# Patient Record
Sex: Male | Born: 1970 | Race: White | Hispanic: No | Marital: Married | Smoking: Current every day smoker
Health system: Southern US, Community
[De-identification: ages and names within clinical notes are randomized; demographics above are authoritative.]

## PROBLEM LIST (undated history)

## (undated) DIAGNOSIS — Z6838 Body mass index (BMI) 38.0-38.9, adult: Secondary | ICD-10-CM

## (undated) DIAGNOSIS — R55 Syncope and collapse: Secondary | ICD-10-CM

## (undated) DIAGNOSIS — I10 Essential (primary) hypertension: Secondary | ICD-10-CM

## (undated) HISTORY — DX: Body mass index (BMI) 38.0-38.9, adult: Z68.38

## (undated) HISTORY — DX: Syncope and collapse: R55

## (undated) HISTORY — PX: BACK SURGERY: SHX140

---

## 2001-06-21 ENCOUNTER — Emergency Department (HOSPITAL_COMMUNITY): Admission: EM | Admit: 2001-06-21 | Discharge: 2001-06-21 | Payer: Self-pay | Admitting: *Deleted

## 2001-06-21 ENCOUNTER — Encounter: Payer: Self-pay | Admitting: *Deleted

## 2002-05-31 ENCOUNTER — Emergency Department (HOSPITAL_COMMUNITY): Admission: EM | Admit: 2002-05-31 | Discharge: 2002-05-31 | Payer: Self-pay | Admitting: Emergency Medicine

## 2002-05-31 ENCOUNTER — Encounter: Payer: Self-pay | Admitting: Emergency Medicine

## 2003-04-26 ENCOUNTER — Emergency Department (HOSPITAL_COMMUNITY): Admission: EM | Admit: 2003-04-26 | Discharge: 2003-04-26 | Payer: Self-pay | Admitting: *Deleted

## 2003-04-26 ENCOUNTER — Encounter: Payer: Self-pay | Admitting: *Deleted

## 2004-12-17 ENCOUNTER — Emergency Department (HOSPITAL_COMMUNITY): Admission: EM | Admit: 2004-12-17 | Discharge: 2004-12-17 | Payer: Self-pay | Admitting: Emergency Medicine

## 2005-07-24 ENCOUNTER — Emergency Department (HOSPITAL_COMMUNITY): Admission: EM | Admit: 2005-07-24 | Discharge: 2005-07-24 | Payer: Self-pay | Admitting: Emergency Medicine

## 2008-08-12 ENCOUNTER — Emergency Department (HOSPITAL_COMMUNITY): Admission: EM | Admit: 2008-08-12 | Discharge: 2008-08-12 | Payer: Self-pay | Admitting: Emergency Medicine

## 2016-02-10 ENCOUNTER — Emergency Department (HOSPITAL_BASED_OUTPATIENT_CLINIC_OR_DEPARTMENT_OTHER)
Admission: EM | Admit: 2016-02-10 | Discharge: 2016-02-10 | Disposition: A | Discharge status: Home / Self Care | Payer: Worker's Compensation | Attending: Emergency Medicine | Admitting: Emergency Medicine

## 2016-02-10 ENCOUNTER — Encounter (HOSPITAL_BASED_OUTPATIENT_CLINIC_OR_DEPARTMENT_OTHER): Payer: Self-pay | Admitting: *Deleted

## 2016-02-10 ENCOUNTER — Emergency Department (HOSPITAL_BASED_OUTPATIENT_CLINIC_OR_DEPARTMENT_OTHER): Payer: Worker's Compensation

## 2016-02-10 DIAGNOSIS — F17210 Nicotine dependence, cigarettes, uncomplicated: Secondary | ICD-10-CM | POA: Insufficient documentation

## 2016-02-10 DIAGNOSIS — Y9389 Activity, other specified: Secondary | ICD-10-CM | POA: Diagnosis not present

## 2016-02-10 DIAGNOSIS — Y9289 Other specified places as the place of occurrence of the external cause: Secondary | ICD-10-CM | POA: Insufficient documentation

## 2016-02-10 DIAGNOSIS — W010XXA Fall on same level from slipping, tripping and stumbling without subsequent striking against object, initial encounter: Secondary | ICD-10-CM | POA: Insufficient documentation

## 2016-02-10 DIAGNOSIS — T148 Other injury of unspecified body region: Secondary | ICD-10-CM | POA: Diagnosis not present

## 2016-02-10 DIAGNOSIS — F0390 Unspecified dementia without behavioral disturbance: Secondary | ICD-10-CM | POA: Diagnosis not present

## 2016-02-10 DIAGNOSIS — I10 Essential (primary) hypertension: Secondary | ICD-10-CM | POA: Insufficient documentation

## 2016-02-10 DIAGNOSIS — Y990 Civilian activity done for income or pay: Secondary | ICD-10-CM | POA: Insufficient documentation

## 2016-02-10 DIAGNOSIS — S80812A Abrasion, left lower leg, initial encounter: Secondary | ICD-10-CM | POA: Diagnosis not present

## 2016-02-10 DIAGNOSIS — T148XXA Other injury of unspecified body region, initial encounter: Secondary | ICD-10-CM

## 2016-02-10 DIAGNOSIS — S8992XA Unspecified injury of left lower leg, initial encounter: Secondary | ICD-10-CM | POA: Diagnosis present

## 2016-02-10 HISTORY — DX: Essential (primary) hypertension: I10

## 2016-02-10 NOTE — ED Notes (Signed)
Pt reports that a concrete slab fell on his left ankle at work.  Reports inability to bear weight since that time. No obvious deformity noted.

## 2016-02-10 NOTE — ED Provider Notes (Signed)
CSN: [id number]     Arrival date & time 02/10/16  1539 History   First MD Initiated Contact with Patient 02/10/16 1553     Chief Complaint  Patient presents with  . Leg Injury    HPI   [age] show male presents today with left leg pain. Patient said he was at work sitting on a concrete bench when he stood up he seemed to dementia and fell landing on the left distal medial aspect of his lower leg and ankle. He reports after the incident he was unable to be awake due to significant pain. Patient denies any prior history of thrombus with the leg, reports chronic back pain for which he takes 1600 mg of ibuprofen daily. Patient reports that he was able to get into the ED with assistance from his boss. Patient denies any loss of distal sensation and strength or motor function.    Past Medical History  Diagnosis Date  . Hypertension    Past Surgical History  Procedure Laterality Date  . Back surgery     History reviewed. No pertinent family history. Social History  Substance Use Topics  . Smoking status: Current Every Day Smoker -- 0.50 packs/day    Types: Cigarettes  . Smokeless tobacco: None  . Alcohol Use: Yes    Review of Systems  All other systems reviewed and are negative.     Allergies  Bee venom  Home Medications   Prior to Admission medications   Medication Sig Start Date End Date Taking? Authorizing Provider  UNKNOWN TO PATIENT    Yes Historical Provider, MD   BP 154/101 mmHg  Pulse 102  Temp(Src) 98.3 F (36.8 C) (Oral)  Resp 18  Ht 5\' 9"  (1.753 m)  Wt 113.399 kg  BMI 36.90 kg/m2  SpO2 98%   Physical Exam  Constitutional: He is oriented to person, place, and time. He appears well-developed and well-nourished.  HENT:  Head: Normocephalic and atraumatic.  Eyes: Conjunctivae are normal. Pupils are equal, round, and reactive to light. Right eye exhibits no discharge. Left eye exhibits no discharge. No scleral icterus.  Neck: Normal range of motion. No JVD  present. No tracheal deviation present.  Pulmonary/Chest: Effort normal. No stridor.  Musculoskeletal:  Very superficial abrasion to the medial aspect of the left lower extremity. Patient has very minor tenderness to palpation of the superior aspect of the medial malleolus and distal shin. Patient is exquisitely tender to even very minor touch. Absent weightbearing were successful, patient appeared to be in discomfort with weightbearing, but had no pain when I forcibly pushed on his heel with bulbous foot. No pain to palpation proximal fibula, no pain to palpation of the proximal foot, sensation intact, Refill less than 3 seconds, pedal pulse 2 plus; no signs of compartment syndrome  Neurological: He is alert and oriented to person, place, and time. Coordination normal.  Psychiatric: He has a normal mood and affect. His behavior is normal. Judgment and thought content normal.  Nursing note and vitals reviewed.   ED Course  Procedures (including critical care time) Labs Review Labs Reviewed - No data to display  Imaging Review Dg Tibia/fibula Left  02/10/2016  CLINICAL DATA:  Pt states a concrete slab fell onto his left lower leg striking medially and around lower calf area, redness and bruising to distal tib/fib both lateral malleolus and medial malleolus EXAM: LEFT TIBIA AND FIBULA - 2 VIEW COMPARISON:  None. FINDINGS: No fracture. Knee and ankle joints are normally aligned. No  bone lesion. There is soft tissue edema along the lower aspect of the leg, most evident medially. No soft tissue air or radiopaque foreign body. IMPRESSION: No fracture or dislocation.  No radiopaque foreign body. Electronically Signed   By: Amie Portland M.D.   On: 02/10/2016 16:40   Dg Ankle Complete Left  02/10/2016  CLINICAL DATA:  Concrete slab fell on left lower leg, medial lower calf pain redness and bruising, medial malleolus pain EXAM: LEFT ANKLE COMPLETE - 3+ VIEW COMPARISON:  None. FINDINGS: Three views of the  left ankle submitted. No acute fracture or subluxation. There is mild spurring of distal tibia and fibula. Ankle mortise is preserved. Plantar and posterior spurring of calcaneus. There is a tiny well corticated bony fragment adjacent to tip of distal fibula most likely from prior injury. IMPRESSION: No acute fracture or subluxation. Mild spurring of distal tibia and fibula. Ankle mortise is preserved. Plantar and posterior spurring of calcaneus. Electronically Signed   By: Natasha Mead M.D.   On: 02/10/2016 16:40   I have personally reviewed and evaluated these images and lab results as part of my medical decision-making.   EKG Interpretation None      MDM   Final diagnoses:  Contusion    Labs:  Imaging:  Consults:  Therapeutics:  Discharge Meds:   Assessment/Plan: 89 show male presents today with left leg pain. Patient has very mild superficial abrasion with no obvious deformity or significant signs of trauma. Patient wincing in pain even prior to me touching his skin, has no pain when I push on the bottom of his foot, but expresses pain with Weightbearing. His X-Ray Showed No Significant Findings of Acute Trauma, He Has No Signs of Compartment Syndrome or Any Signs That Would Indicate Occult Fracture. Patient will be given instructions to continue using ibuprofen, ice, crutches as needed, follow up with his primary care for reevaluation and further management. Patient given strict cautions, verbalize understanding and agreement to the basement and no further questions or concerns at time discharge.         Eyvonne Mechanic, PA-C 02/10/16 1647  Loren Racer, MD 02/10/16 [phone]

## 2016-02-10 NOTE — ED Notes (Signed)
Demonstrated crutch instructions well. Declined offer for tylenol/motrin. Declined wheelchair.

## 2017-01-07 ENCOUNTER — Telehealth: Payer: Self-pay | Admitting: *Deleted

## 2017-01-07 NOTE — Telephone Encounter (Signed)
NOTES SENT TO SCHEDULING.  °

## 2017-01-11 ENCOUNTER — Ambulatory Visit: Payer: Self-pay | Admitting: Cardiology

## 2017-01-16 DIAGNOSIS — R059 Cough, unspecified: Secondary | ICD-10-CM | POA: Insufficient documentation

## 2017-01-16 DIAGNOSIS — J029 Acute pharyngitis, unspecified: Secondary | ICD-10-CM | POA: Insufficient documentation

## 2017-01-16 DIAGNOSIS — R05 Cough: Secondary | ICD-10-CM | POA: Insufficient documentation

## 2017-01-16 DIAGNOSIS — M791 Myalgia, unspecified site: Secondary | ICD-10-CM | POA: Insufficient documentation

## 2017-01-16 DIAGNOSIS — R55 Syncope and collapse: Secondary | ICD-10-CM | POA: Insufficient documentation

## 2017-01-16 DIAGNOSIS — R0981 Nasal congestion: Secondary | ICD-10-CM | POA: Insufficient documentation

## 2017-01-16 DIAGNOSIS — H538 Other visual disturbances: Secondary | ICD-10-CM | POA: Insufficient documentation

## 2017-01-16 DIAGNOSIS — R42 Dizziness and giddiness: Secondary | ICD-10-CM | POA: Insufficient documentation

## 2017-01-17 ENCOUNTER — Encounter (INDEPENDENT_AMBULATORY_CARE_PROVIDER_SITE_OTHER): Payer: Self-pay

## 2017-01-17 ENCOUNTER — Ambulatory Visit (INDEPENDENT_AMBULATORY_CARE_PROVIDER_SITE_OTHER): Payer: BLUE CROSS/BLUE SHIELD | Admitting: Internal Medicine

## 2017-01-17 VITALS — BP 132/86 | HR 76 | Ht 68.0 in | Wt 248.0 lb

## 2017-01-17 DIAGNOSIS — R42 Dizziness and giddiness: Secondary | ICD-10-CM | POA: Diagnosis not present

## 2017-01-17 DIAGNOSIS — R55 Syncope and collapse: Secondary | ICD-10-CM | POA: Diagnosis not present

## 2017-01-17 NOTE — Patient Instructions (Signed)
Medication Instructions:    Your physician recommends that you continue on your current medications as directed. Please refer to the Current Medication list given to you today.  --- If you need a refill on your cardiac medications before your next appointment, please call your pharmacy. ---  Labwork:  None ordered  Testing/Procedures: Your physician has requested that you have an echocardiogram. Echocardiography is a painless test that uses sound waves to create images of your heart. It provides your doctor with information about the size and shape of your heart and how well your heart's chambers and valves are working. This procedure takes approximately one hour. There are no restrictions for this procedure.   Your physician has recommended that you have a tilt table test with possible loop recorder insertion. This test is sometimes used to help determine the cause of fainting spells. You lie on a table that moves from a lying down to an upright position. The change in position can bring on loss of consciousness. The doctor monitors your symptoms, heart rate, EKG, and blood pressure throughout the test. The doctor also may give you a medicine and then monitor your response to the medicine. This is done in the hospital and usually takes half of a day to complete the procedure.   Herbert Seta, RN will call you to arrange this procedure.    Follow-Up:  To be determined once procedure is scheduled.  Thank you for choosing [facility]!!     Any Other Special Instructions Will Be Listed Below (If Applicable).   Tilt Table Test A tilt table test is a test to find the cause of unexplained problems, such as:  Fainting.  Dizziness.  Falls.  A drop in blood pressure when standing up (orthostatic hypotension). For this test, you will be safely secured to a table that moves you from a lying position to an upright position. Your heart rhythm and blood pressure will be monitored during the  test. While having this test, it is normal to:  Feel lightheaded or dizzy.  Faint.  Feel nauseous or vomit.  Have blurry vision.  Feel cold or clammy. Tell a health care provider about:  All medicines you are taking, including vitamins, herbs, eye drops, creams, and over-the-counter medicines.  Any surgeries you have had.  Any medical conditions you have.  Whether you are pregnant or may be pregnant. What are the risks? There are no risks or complications associated with this test. What happens before the procedure?  Follow instructions from your health care provider about eating or drinking restrictions.  Ask your health care provider about changing or stopping your regular medicines. This is especially important if you are taking diabetes medicines or blood thinners.  Let your primary health care provider know that you are having this test.  Plan to have someone take you home after the test. What happens during the procedure?  You will be asked to lie down on a table. The table will have a footboard and safety straps to keep you in place.  An IV tube will be inserted into a vein in your hand or arm.  Your blood pressure, heart rate, breathing rate, and blood oxygen level will be monitored throughout the test.  While you are lying down:  A stethoscope will be used to listen to arteries in your neck (carotid arteries) to check for unusual sounds (bruits).  Your carotid arteries will be pressed on one at a time, for several seconds each.  You will be  placed in an upright position. You will need to tell your health care provider right away if you feel dizzy or like you are going to faint.  If you feel dizzy, your blood pressure drops, or your heart rate drops, you will be placed in a flat or head-down position. If your heart rate or blood pressure does not return to normal after that, you may be given medicine to help with symptoms of low blood pressure or a slow heart  rate. The medicine may be given under your tongue or through the IV tube.  If you do not have symptoms when placed in an upright position, medicine may be given to make you feel dizzy. The medicine may be given under your tongue or through the IV tube. This test may vary among health care providers and hospitals. What happens after the procedure?  Your health care provider will tell you when may go home.  It is your responsibility to get your test results. Ask your health care provider or the department performing the test when your results will be ready. This information is not intended to replace advice given to you by your health care provider. Make sure you discuss any questions you have with your health care provider. Document Released: 10/17/2004 Document Revised: 07/08/2016 Document Reviewed: 07/30/2015 Elsevier Interactive Patient Education  2017 [employer]

## 2017-01-17 NOTE — Progress Notes (Signed)
ELECTROPHYSIOLOGY CONSULT NOTE  Patient ID: Roberto Turner, MRN: [id number], DOB/AGE: 1971/07/06 46 y.o. Admit date: (Not on file) Date of Consult: 01/17/2017  Primary Physician: Selinda Flavin, MD Primary Cardiologist: NEW Consulting Physician NEW  Chief Complaint: Roberto Turner   HPI Roberto Turner is a 46 y.o. male  Referred for evaluation for recurrent syncope.  He has had multiple syncopal episodes over his lifetime although the first was incurred in the context of severe pain i.e. back pain and or nephrolithiasis. More recent episodes occurred in some poorly defined timeframe in the last couple of months and a temporal relationship to an antecedent viral illness that neither he nor his wife can clarify.  The first episode occurred when he got up out of the chair walk across the room got dizzy and lost consciousness was out for about 1-2 minutes. He was able to awaken and stand without residual symptoms. His wife does not describe skin color. His other episode is hard to understand. Fall asleep on the side of the bed. His son pulled his arm and he fell sideways off the bed and awakened on the floor he was able to stand without residua.  He denies orthostatic symptoms. He denies heat intolerance or shower intolerance.  He has hypertension and has been on losartan for some time. He has had no palpitations. He has no chest discomfort. He denies exercise intolerance.\        Past Medical History:  Diagnosis Date  . Body mass index (bmi) 38.0-38.9, adult   . Hypertension   . Syncope and collapse       Surgical History:  Past Surgical History:  Procedure Laterality Date  . BACK SURGERY       Home Meds: Prior to Admission medications   Medication Sig Start Date End Date Taking? Authorizing Provider  ibuprofen (ADVIL,MOTRIN) 800 MG tablet Take 1 tablet by mouth daily. 12/13/16  Yes Historical Provider, MD  losartan (COZAAR) 100 MG tablet Take 100 mg by mouth daily.   Yes Historical  Provider, MD    Allergies:  Allergies  Allergen Reactions  . Bee Venom Anaphylaxis    Social History   Social History  . Marital status: Married    Spouse name: N/A  . Number of children: N/A  . Years of education: N/A   Occupational History  . Not on file.   Social History Main Topics  . Smoking status: Current Every Day Smoker    Packs/day: 0.50    Types: Cigarettes  . Smokeless tobacco: Never Used  . Alcohol use Yes  . Drug use: No  . Sexual activity: Not on file   Other Topics Concern  . Not on file   Social History Narrative  . No narrative on file     No family history on file.   ROS:  Please see the history of present illness.     All other systems reviewed and negative.    Physical Exam: Blood pressure 132/86, pulse 76, height 5\' 8"  (1.727 m), weight 248 lb (112.5 kg), SpO2 97 %. General: Well developed, well nourished male in no acute distress. Head: Normocephalic, atraumatic, sclera non-icteric, no xanthomas, nares are without discharge. EENT: normal  Lymph Nodes:  none Neck: Negative for carotid bruits. JVD not elevated. Back:without scoliosis kyphosis Lungs: Clear bilaterally to auscultation without wheezes, rales, or rhonchi. Breathing is unlabored. Heart: RRR with S1 S2. No   murmur . No rubs, or gallops appreciated. Abdomen: Soft, non-tender,  non-distended with normoactive bowel sounds. No hepatomegaly. No rebound/guarding. No obvious abdominal masses. Msk:  Strength and tone appear normal for age. Extremities: No clubbing or cyanosis. No  edema.  Distal pedal pulses are 2+ and equal bilaterally. Skin: Warm and Dry Neuro: Alert and oriented X 3. CN III-XII intact Grossly normal sensory and motor function . Psych:  Responds to questions appropriately with a normal affect.      Labs: Cardiac Enzymes No results for input(s): CKTOTAL, CKMB, TROPONINI in the last 72 hours. CBC No results found for: WBC, HGB, HCT, MCV, PLT PROTIME: No results  for input(s): LABPROT, INR in the last 72 hours. Chemistry No results for input(s): NA, K, CL, CO2, BUN, CREATININE, CALCIUM, PROT, BILITOT, ALKPHOS, ALT, AST, GLUCOSE in the last 168 hours.  Invalid input(s): LABALBU Lipids No results found for: CHOL, HDL, LDLCALC, TRIG BNP No results found for: PROBNP Thyroid Function Tests: No results for input(s): TSH, T4TOTAL, T3FREE, THYROIDAB in the last 72 hours.  Invalid input(s): FREET3 Miscellaneous No results found for: DDIMER  Radiology/Studies:  No results found.  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 There are couple of things that would suggest that this is noncardiac, and these would include his prior history of syncope is orthostatic intolerance as manifested today. To exclude cardiac syncope however, it is important to look at left ventricular function. Cardiac syncope is associated with a much higher risk of sudden death. His QRS is not normal. It could be wide for LVH; however, [place] QRS widening is associated with a higher risk of an arrhythmic cause of syncope and loop recording may be of value  His symptoms however are still more likely vasovagal motor; given that and his atypical symptoms I think tilt table testing affords us our best chance at a right diagnosis without invasive testing.  If he were tilt negative I would recommend a loop recorder insertion given  recurrent events and the atypical history  Part of the challenge here is that even if this is reflex syncope I don't see the trigger and his prodrome isn't long enough to protect himself.  He is advised not to drive for 6 months   He also needs sleep study and therapy for his sleep  apnea          Sherryl MangesSteven Savvy Peeters

## 2017-01-21 NOTE — Addendum Note (Signed)
Addended by: Dareen PianoKENAN, Deania Siguenza C on: 01/21/2017 02:46 PM   Modules accepted: Orders

## 2017-01-23 ENCOUNTER — Telehealth: Payer: Self-pay | Admitting: Internal Medicine

## 2017-01-23 NOTE — Telephone Encounter (Signed)
I left a message for the patient to call to try to arrange his tilt table test with possible loop recorder.

## 2017-01-25 NOTE — Telephone Encounter (Signed)
Late entry- call received back from the patient on 01/24/17. He states that his understanding was to get the echo results first then decide if the EP study and LINQ were needed. I advised I will follow up with Dr. Graciela HusbandsKlein about this. He is agreeable.

## 2017-02-01 ENCOUNTER — Ambulatory Visit (HOSPITAL_COMMUNITY)
Admission: RE | Admit: 2017-02-01 | Discharge: 2017-02-01 | Disposition: A | Discharge status: Home / Self Care | Payer: BLUE CROSS/BLUE SHIELD | Source: Ambulatory Visit | Attending: Internal Medicine | Admitting: Internal Medicine

## 2017-02-01 DIAGNOSIS — R55 Syncope and collapse: Secondary | ICD-10-CM | POA: Diagnosis not present

## 2017-02-01 NOTE — Progress Notes (Signed)
  Echocardiogram 2D Echocardiogram has been performed.  Delcie RochENNINGTON, Lorenz Donley 02/01/2017, 10:28 AM

## 2017-02-04 ENCOUNTER — Telehealth: Payer: Self-pay | Admitting: Internal Medicine

## 2017-02-04 NOTE — Telephone Encounter (Signed)
Follow Up:    Pt wants to know if his Echo test results are ready from 02-01-17 please?

## 2017-02-04 NOTE — Telephone Encounter (Signed)
Follow up ° ° ° °Pt is calling about his echo results.  °

## 2017-02-04 NOTE — Telephone Encounter (Signed)
I called and spoke with the patient. I advised him Dr. Graciela HusbandsKlein is out of the office this week and will not sign off on his report until the beginning of next week. Preliminary echo results given.  I advised I will call back next week with Dr. Odessa FlemingKlein's recommendations. He voices understanding.

## 2017-02-18 ENCOUNTER — Telehealth: Payer: Self-pay | Admitting: Cardiology

## 2017-02-18 DIAGNOSIS — R931 Abnormal findings on diagnostic imaging of heart and coronary circulation: Secondary | ICD-10-CM

## 2017-02-18 NOTE — Telephone Encounter (Signed)
Follow Up:    Pt retuning Heather's call from Friday,concerning his Echo results.

## 2017-02-18 NOTE — Telephone Encounter (Signed)
Notes recorded by Duke Salvia, MD on 02/11/2017 at 11:29 AM EDT Please Inform Patient Echo showed ab norma heart muscle function with ef 45% We dont suspect CAD but prior to tilt would recomment MRI if can tolerate otherwise myoview Thanks steve  02/18/17--I spoke with pt's wife, Roberto Turner.--Pt at work. I discussed Dr Odessa Fleming comments and recommendations with her.   Roberto Turner is going to discuss with pt and ask him to give [place] a call back.

## 2017-02-21 NOTE — Telephone Encounter (Signed)
Follow Up    Per pt returning phone call from nurse. Was not sure what it was pertaining to. Requesting call back from nurse

## 2017-02-21 NOTE — Telephone Encounter (Signed)
The patient is aware of his results. He is agreeable with Dr. Odessa Fleming recommendations for a cardiac MRI as he has not problem going through an MRI scanner.  I advised I will order this and Dirk Dress will be in touch to schedule.

## 2017-02-22 ENCOUNTER — Telehealth: Payer: Self-pay | Admitting: Internal Medicine

## 2017-02-22 NOTE — Telephone Encounter (Signed)
Called the patient and left a voicemail to call me with a preferred day and time for his cardiac MRI.

## 2017-02-25 ENCOUNTER — Telehealth: Payer: Self-pay | Admitting: Internal Medicine

## 2017-02-25 NOTE — Telephone Encounter (Signed)
Called patient and left a voicemail for him to call me back regarding scheduling his cardiac MRI.

## 2017-02-27 ENCOUNTER — Encounter: Payer: Self-pay | Admitting: Internal Medicine

## 2017-02-27 ENCOUNTER — Telehealth: Payer: Self-pay | Admitting: Internal Medicine

## 2017-02-27 NOTE — Telephone Encounter (Signed)
Called patient and left voicemail regarding cardiac MRI scheduled for 03-06-17 at 3 p.m. At [facility].  Letter mailed today.

## 2017-03-01 ENCOUNTER — Telehealth: Payer: Self-pay | Admitting: Internal Medicine

## 2017-03-01 NOTE — Telephone Encounter (Signed)
New message    Pt states that he wants to go ahead with the procedure you two had talked about and wants to let you know.

## 2017-03-05 ENCOUNTER — Telehealth: Payer: Self-pay | Admitting: Internal Medicine

## 2017-03-05 NOTE — Telephone Encounter (Signed)
Called the patient and left a message regarding his appointment tomorrow for his MRI.

## 2017-03-06 ENCOUNTER — Ambulatory Visit (HOSPITAL_COMMUNITY)
Admission: RE | Admit: 2017-03-06 | Discharge: 2017-03-06 | Disposition: A | Discharge status: Home / Self Care | Payer: BLUE CROSS/BLUE SHIELD | Source: Ambulatory Visit | Attending: Internal Medicine | Admitting: Internal Medicine

## 2017-03-06 ENCOUNTER — Ambulatory Visit (HOSPITAL_COMMUNITY): Payer: BLUE CROSS/BLUE SHIELD

## 2017-03-06 DIAGNOSIS — R931 Abnormal findings on diagnostic imaging of heart and coronary circulation: Secondary | ICD-10-CM

## 2017-03-06 DIAGNOSIS — R55 Syncope and collapse: Secondary | ICD-10-CM | POA: Diagnosis not present

## 2017-03-06 LAB — CREATININE, SERUM
Creatinine, Ser: 1.08 mg/dL (ref 0.61–1.24)
GFR calc Af Amer: >60 mL/min (ref >60)
GFR calc non Af Amer: >60 mL/min (ref >60)

## 2017-03-06 MED ORDER — GADOBENATE DIMEGLUMINE 529 MG/ML IV SOLN
40.0000 mL | Freq: Once | INTRAVENOUS | Status: AC | PRN
  Administered 2017-03-06: 40 mL via INTRAVENOUS

## 2017-03-12 ENCOUNTER — Telehealth: Payer: Self-pay | Admitting: Internal Medicine

## 2017-03-12 NOTE — Telephone Encounter (Signed)
New message    Pt is calling about the results of his blood work and his procedure he had done. He said the results were on mychart, but he did not understand.

## 2017-03-12 NOTE — Telephone Encounter (Signed)
Will review next steps with Dr. Graciela Husbands based on MRI report prior to calling the patient.

## 2017-03-19 NOTE — Telephone Encounter (Signed)
Notes recorded by Jefferey Pica, RN on 03/12/2017 at 7:02 PM EDT Reviewed with Dr. Graciela Husbands- since the Cardiac MRI is normal, next step would be to proceed with a tilt table test and it they is negative implant a LINQ. The patient is aware of his results and Dr. Odessa Fleming recommendations. He is agreeable- per Dr. Graciela Husbands, he will speak with the EP lab staff to see if we can work this in prior to 04/17/17.  The patient is aware I will call him back next week at the latest.   Per Dr. Graciela Husbands- no way to work in the Tilt/ with possible LINQ prior to 04/17/17 per his discussion with the EP lab.  I have left a message for the patient to call to schedule for 04/17/17.

## 2017-03-20 ENCOUNTER — Encounter: Payer: Self-pay | Admitting: *Deleted

## 2017-03-20 NOTE — Telephone Encounter (Signed)
I spoke with the patient. He is aware that it will be 04/17/17 before we can do his Tilt Tablet with +/- LINQ implant. He is agreeable. This is scheduled for 2pm- he is aware to arrive at 12pm Instructions reviewed with the patient and paper copy mailed to him. He verbalizes understanding.

## 2017-04-03 ENCOUNTER — Telehealth: Payer: Self-pay

## 2017-04-03 NOTE — Telephone Encounter (Signed)
Patient called back and spoke with Dennis BastKelly Lanier, RN. He is aware of procedure time change and NPO after midnight.

## 2017-04-03 NOTE — Telephone Encounter (Addendum)
Left message on patients home voicemail to call back to go over change in procedure time for Tilt table test with or without Linq implant. scheduled for 04/17/17. Patient will need to arrive at hospital at 11 am rather then 12 pm. Patient will also need to be instructed that he cannot eat or drink anything after midnight. Instructed patient to call the office back to discuss changes.

## 2017-04-17 ENCOUNTER — Ambulatory Visit (HOSPITAL_COMMUNITY)
Admission: RE | Admit: 2017-04-17 | Discharge: 2017-04-17 | Disposition: A | Discharge status: Home / Self Care | Payer: BLUE CROSS/BLUE SHIELD | Source: Ambulatory Visit | Attending: Internal Medicine | Admitting: Internal Medicine

## 2017-04-17 ENCOUNTER — Encounter (HOSPITAL_COMMUNITY)
Admission: RE | Disposition: A | Discharge status: Home / Self Care | Payer: Self-pay | Source: Ambulatory Visit | Attending: Internal Medicine

## 2017-04-17 DIAGNOSIS — F17210 Nicotine dependence, cigarettes, uncomplicated: Secondary | ICD-10-CM | POA: Diagnosis not present

## 2017-04-17 DIAGNOSIS — Z6837 Body mass index (BMI) 37.0-37.9, adult: Secondary | ICD-10-CM | POA: Diagnosis not present

## 2017-04-17 DIAGNOSIS — R55 Syncope and collapse: Secondary | ICD-10-CM | POA: Diagnosis not present

## 2017-04-17 DIAGNOSIS — Z79899 Other long term (current) drug therapy: Secondary | ICD-10-CM | POA: Insufficient documentation

## 2017-04-17 DIAGNOSIS — E669 Obesity, unspecified: Secondary | ICD-10-CM | POA: Insufficient documentation

## 2017-04-17 DIAGNOSIS — I10 Essential (primary) hypertension: Secondary | ICD-10-CM | POA: Diagnosis not present

## 2017-04-17 HISTORY — PX: TILT TABLE STUDY: EP1226

## 2017-04-17 SURGERY — TILT TABLE STUDY

## 2017-04-17 MED ORDER — DEXTROSE 5 % IV SOLN
INTRAVENOUS | Status: DC
  Administered 2017-04-17: 12:00:00 via INTRAVENOUS

## 2017-04-17 MED ORDER — NITROGLYCERIN 0.4 MG SL SUBL
SUBLINGUAL_TABLET | SUBLINGUAL | Status: DC | PRN
  Administered 2017-04-17: .4 mg via SUBLINGUAL

## 2017-04-17 MED ORDER — SODIUM CHLORIDE 0.9 % IV SOLN
INTRAVENOUS | Status: DC
Start: 2017-04-17 — End: 2017-04-17
  Administered 2017-04-17: 13:00:00 via INTRAVENOUS

## 2017-04-17 MED ORDER — NITROGLYCERIN 0.4 MG SL SUBL
SUBLINGUAL_TABLET | SUBLINGUAL | Status: AC
  Filled 2017-04-17: qty 1

## 2017-04-17 SURGICAL SUPPLY — 1 items: PAD DEFIB LIFELINK (PAD) ×1 IMPLANT

## 2017-04-17 NOTE — H&P (Signed)
      Patient Care Team: Selinda FlavinHoward, Kevin, MD as PCP - General (Family Medicine)   HPI  Roberto Turner is a 46 y.o. male Admitted for tilt table testing and possible LINQ insertion  He has recurrent syncope with features suggestive and not of neurally mediated mechanisms. He has modest prolongation of his intrinsic QRS--120 msec concerning for bradyarrhythmia based on ISSUE 3   No intercurrent events  Records and Results Reviewed   Past Medical History:  Diagnosis Date  . Body mass index (bmi) 38.0-38.9, adult   . Hypertension   . Syncope and collapse     Past Surgical History:  Procedure Laterality Date  . BACK SURGERY      Current Facility-Administered Medications  Medication Dose Route Frequency Provider Last Rate Last Dose  . 0.9 %  sodium chloride infusion   Intravenous Continuous Duke SalviaKlein, Steven C, MD 200 mL/hr at 04/17/17 1305      Allergies  Allergen Reactions  . Bee Venom Anaphylaxis      Social History  Substance Use Topics  . Smoking status: Current Every Day Smoker    Packs/day: 0.50    Types: Cigarettes  . Smokeless tobacco: Never Used  . Alcohol use Yes     No family history on file.   No current facility-administered medications on file prior to encounter.    Current Outpatient Prescriptions on File Prior to Encounter  Medication Sig Dispense Refill  . ibuprofen (ADVIL,MOTRIN) 800 MG tablet Take 800 mg by mouth daily as needed for mild pain.   3  . losartan (COZAAR) 100 MG tablet Take 100 mg by mouth daily.        Review of Systems negative except from HPI and PMH  Physical Exam BP (!) 137/106 (BP Location: Left Arm)   Pulse 64   Temp 98.3 F (36.8 C) (Oral)   Resp 18   Ht 5\' 9"  (1.753 m)   Wt 250 lb (113.4 kg)   BMI 36.92 kg/m  Well developed and nourished in no acute distress HENT normal Neck supple with JVP-flat Carotids brisk and full without bruits Clear Regular rate and rhythm, no murmurs or gallops Abd-soft with active BS  without hepatomegaly No Clubbing cyanosis edema Skin-warm and dry A & Oriented  Grossly normal sensory and motor function     Assessment and  Plan  Syncope    hypertension-treated    QRS widening   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

## 2017-04-18 ENCOUNTER — Encounter (HOSPITAL_COMMUNITY): Payer: Self-pay | Admitting: Internal Medicine

## 2018-05-07 IMAGING — MR MR CARD MORPHOLOGY WO/W CM
10 of 11 series · 39 of 40 positions shown · IV contrast (multihance)
Comparison: none

CLINICAL DATA: Syncope, decreased EF on echo.

EXAM:
CARDIAC MRI
TECHNIQUE: The patient was scanned on a 1.5 Tesla GE magnet. A dedicated
cardiac coil was used. Functional imaging was done using Fiesta
sequences. [DATE], and 4 chamber views were done to assess for RWMA's.
Modified Val rule using a short axis stack was used to
calculate an ejection fraction on a dedicated work station using
Circle software. The patient received 40 cc of Multihance. After 10
minutes inversion recovery sequences were used to assess for
infiltration and scar tissue.
CONTRAST:  40 cc Multihance

[Series 3: bSSFP · sagittal · 8.0mm · 1.48mm/px · 1 of 19 slices shown (1 of 5)]
[im 1/19]
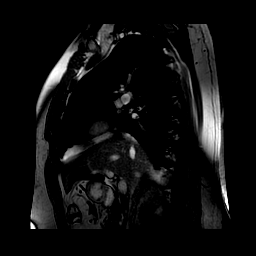

[Series 4: bSSFP · axial · 8.0mm · 1.48mm/px · 1 of 20 slices shown (2 of 5)]
[im 1/20]
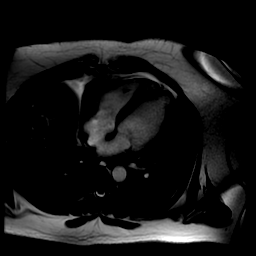

[Series 5: bSSFP · axial · 8.0mm · 1.48mm/px · z∈[-83,-39]mm · 7 of 140 slices shown (3 of 5)]
[im 1/140]
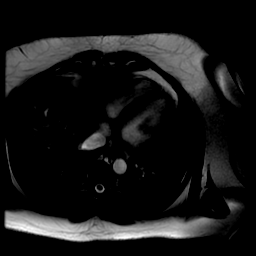
[im 24/140]
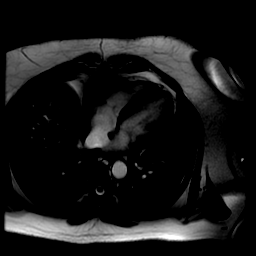
[im 47/140]
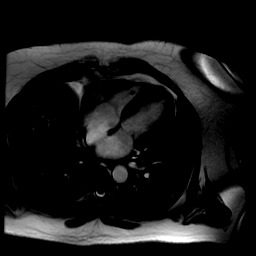
[im 70/140]
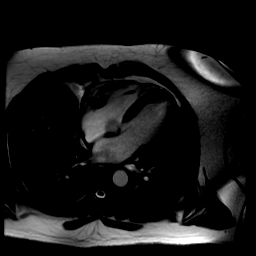
[im 93/140]
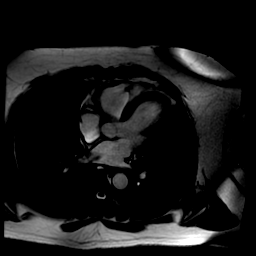
[im 116/140]
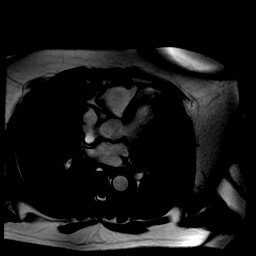
[im 140/140]
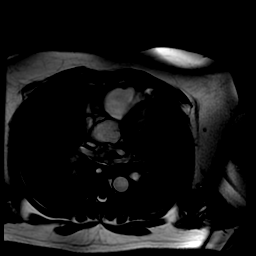

[Series 6: bSSFP · oblique · 8.0mm · 1.45mm/px · 16 of 300 slices shown (4 of 5)]
[im 1/300]
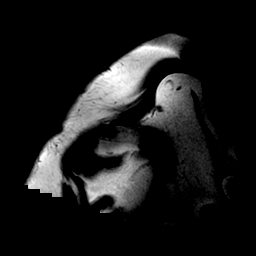
[im 20/300]
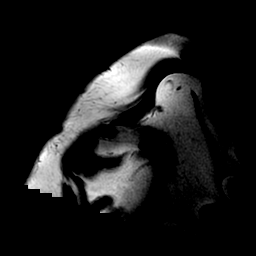
[im 40/300]
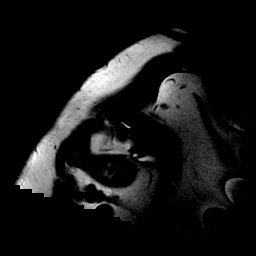
[im 60/300]
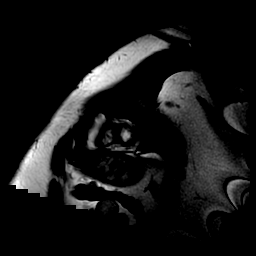
[im 80/300]
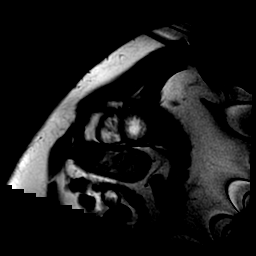
[im 100/300]
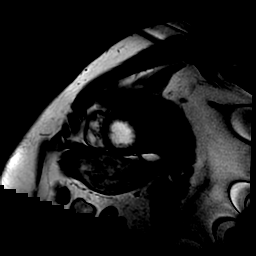
[im 120/300]
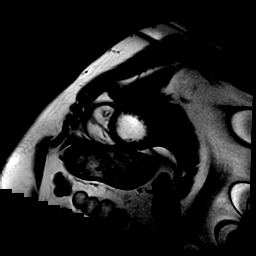
[im 140/300]
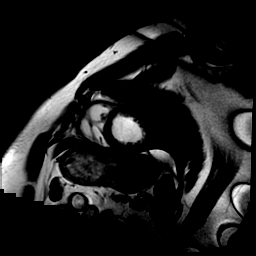
[im 160/300]
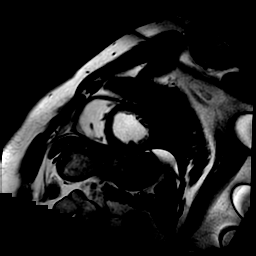
[im 180/300]
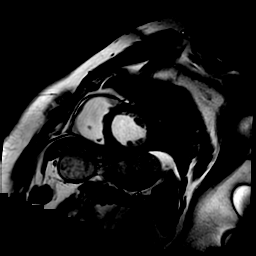
[im 200/300]
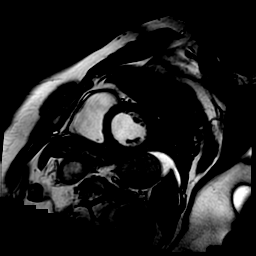
[im 220/300]
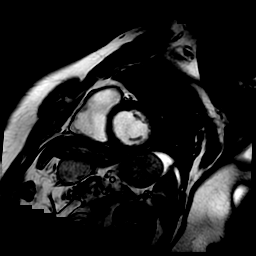
[im 240/300]
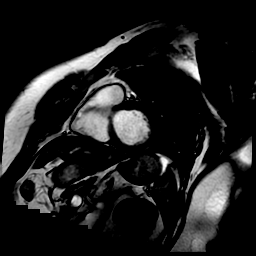
[im 260/300]
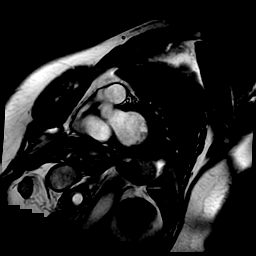
[im 280/300]
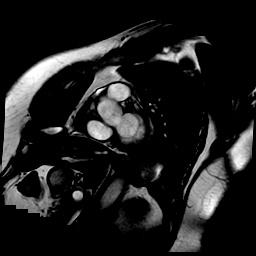
[im 300/300]
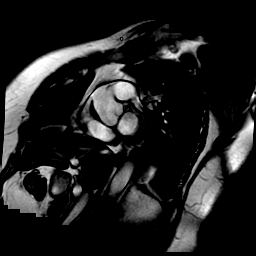

[Series 8: tags grid sa · oblique · 8.0mm · 1.45mm/px · 3 of 60 slices shown]
[im 1/60]
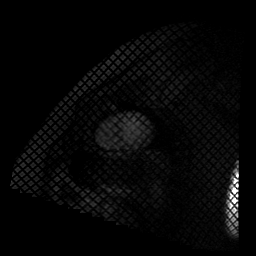
[im 30/60]
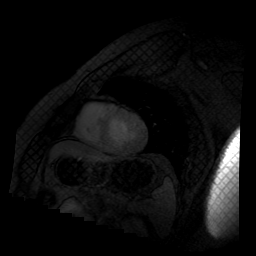
[im 60/60]
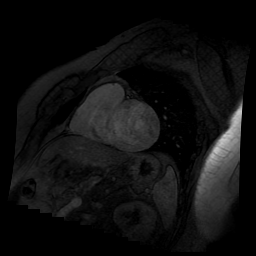

[Series 9: tags grid rad · oblique · 8.0mm · 1.56mm/px · 3 of 60 slices shown]
[im 1/60]
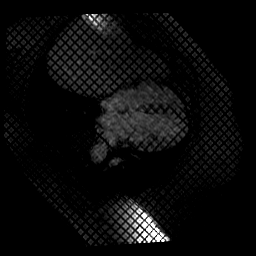
[im 30/60]
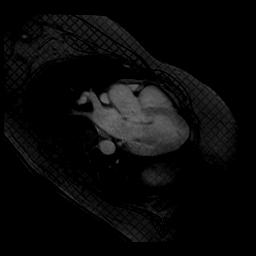
[im 60/60]
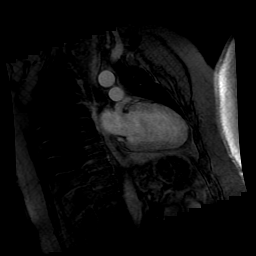

[Series 13: qpqs (id) · axial · 8.0mm · 1.48mm/px · z∈[+9,+9]mm · 3 of 60 slices shown]
[im 1/60]
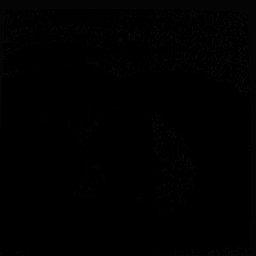
[im 30/60]
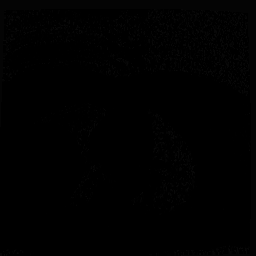
[im 60/60]
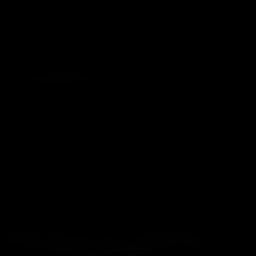

[Series 14: bSSFP · oblique · 8.0mm · 1.56mm/px · 3 of 60 slices shown (5 of 5)]
[im 1/60]
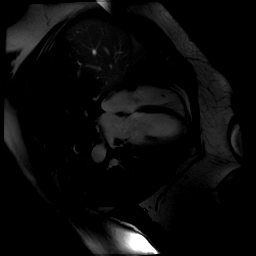
[im 30/60]
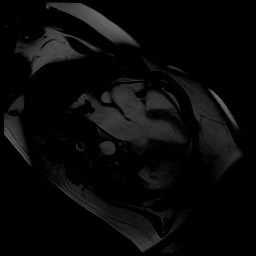
[im 60/60]
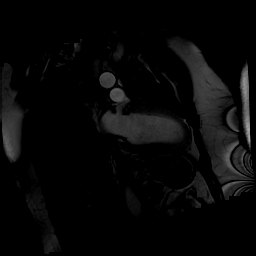

[Series 17: delayed ir prep · oblique · 8.0mm · 1.45mm/px · 1 of 12 slices shown]
[im 1/12]
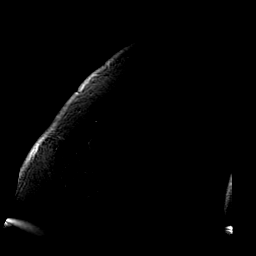

[Series 20: rad mde · oblique · 8.0mm · 1.56mm/px · 1 of 3 slices shown]
[im 1/3]
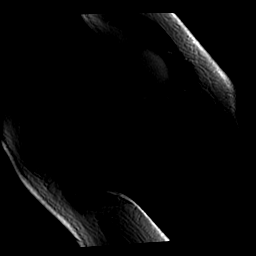

[39 of 40 positions shown; findings below may reference images not displayed]

FINDINGS: Normal left ventricular size, normal wall thickness. Normal LV
systolic function, EF 64%. Normal wall motion. Normal right
ventricular size and systolic function. Normal right and left atrial
sizes. Trileaflet aortic valve with no significant stenosis or
regurgitation. No significant mitral regurgitation noted.

On delayed enhancement imaging, there was no myocardial late
gadolinium enhancement (LGE).

MEASUREMENTS:
MEASUREMENTS
LVEDV 164 mL

LVSV 105 mL

LV EF 64%
IMPRESSION: 1. Normal LV size and systolic function, EF 64%. No regional wall
motion abnormalities.

2.  Normal RV size and systolic function.

3. No myocardial LGE, so no definitive evidence for prior MI,
infiltrative disease, or myocarditis.

Franko Delacruz

## 2018-11-21 ENCOUNTER — Emergency Department (HOSPITAL_COMMUNITY): Payer: Self-pay

## 2018-11-21 ENCOUNTER — Emergency Department (HOSPITAL_COMMUNITY)
Admission: EM | Admit: 2018-11-21 | Discharge: 2018-11-21 | Disposition: A | Discharge status: Home / Self Care | Payer: Self-pay | Attending: Emergency Medicine | Admitting: Emergency Medicine

## 2018-11-21 ENCOUNTER — Encounter (HOSPITAL_COMMUNITY): Payer: Self-pay | Admitting: *Deleted

## 2018-11-21 ENCOUNTER — Other Ambulatory Visit: Payer: Self-pay

## 2018-11-21 DIAGNOSIS — I2510 Atherosclerotic heart disease of native coronary artery without angina pectoris: Secondary | ICD-10-CM | POA: Insufficient documentation

## 2018-11-21 DIAGNOSIS — R072 Precordial pain: Secondary | ICD-10-CM | POA: Insufficient documentation

## 2018-11-21 DIAGNOSIS — F17210 Nicotine dependence, cigarettes, uncomplicated: Secondary | ICD-10-CM | POA: Insufficient documentation

## 2018-11-21 DIAGNOSIS — I10 Essential (primary) hypertension: Secondary | ICD-10-CM | POA: Insufficient documentation

## 2018-11-21 LAB — CBC
HCT: 49.2 % (ref 39.0–52.0)
HEMOGLOBIN: 15.8 g/dL (ref 13.0–17.0)
MCH: 26.6 pg (ref 26.0–34.0)
MCHC: 32.1 g/dL (ref 30.0–36.0)
MCV: 83 fL (ref 80.0–100.0)
Platelets: 232 10*3/uL (ref 150–400)
RBC: 5.93 MIL/uL — ABNORMAL HIGH (ref 4.22–5.81)
RDW: 13.6 % (ref 11.5–15.5)
WBC: 9.7 10*3/uL (ref 4.0–10.5)
nRBC: 0 % (ref 0.0–0.2)

## 2018-11-21 LAB — BASIC METABOLIC PANEL
Anion gap: 8 (ref 5–15)
BUN: 18 mg/dL (ref 6–20)
CALCIUM: 8.8 mg/dL — AB (ref 8.9–10.3)
CO2: 20 mmol/L — ABNORMAL LOW (ref 22–32)
CREATININE: 1.17 mg/dL (ref 0.61–1.24)
Chloride: 110 mmol/L (ref 98–111)
Glucose, Bld: 120 mg/dL — ABNORMAL HIGH (ref 70–99)
Potassium: 3.7 mmol/L (ref 3.5–5.1)
SODIUM: 138 mmol/L (ref 135–145)

## 2018-11-21 LAB — I-STAT TROPONIN, ED
TROPONIN I, POC: 0 ng/mL (ref 0.00–0.08)
Troponin i, poc: 0 ng/mL (ref 0.00–0.08)

## 2018-11-21 MED ORDER — ASPIRIN 81 MG PO CHEW
324.0000 mg | CHEWABLE_TABLET | Freq: Once | ORAL | Status: AC
  Administered 2018-11-21: 324 mg via ORAL
  Filled 2018-11-21: qty 4

## 2018-11-21 NOTE — ED Provider Notes (Signed)
[facility] EMERGENCY DEPARTMENT Provider Note   CSN: [id number] Arrival date & time: 11/21/18  0020     History   Chief Complaint Chief Complaint  Patient presents with  . Chest Pain    HPI KEVIAN Turner is a 48 y.o. male.  The history is provided by the patient.  Chest Pain   This is a new problem. The problem occurs constantly. The problem has been resolved. The pain is associated with exertion. The pain is present in the substernal region. The pain is moderate. The pain does not radiate. Associated symptoms include diaphoresis and shortness of breath. Pertinent negatives include no cough, no fever and no vomiting. He has tried nothing for the symptoms.  Pertinent negatives for past medical history include no CAD and no PE.  Presents for chest pain. He reports he was at work doing minimal activity when he began having chest pressure as someone was pushing against his chest with associated shortness of breath, he felt clammy, weakness.  No syncope.  No fevers or vomiting.  No recent cough or flulike illness.  He is now improved.  He has never had this before, no known history of CAD.  fam hx - positive for CAD Soc hx - he is a smoker  Past Medical History:  Diagnosis Date  . Body mass index (bmi) 38.0-38.9, adult   . Hypertension   . Syncope and collapse     Patient Active Problem List   Diagnosis Date Noted  . Syncope 01/16/2017  . Dizzinesses 01/16/2017  . Blurred vision 01/16/2017  . Myalgia 01/16/2017  . Cough 01/16/2017  . Sinus congestion 01/16/2017  . Sore throat 01/16/2017    Past Surgical History:  Procedure Laterality Date  . BACK SURGERY    . TILT TABLE STUDY N/A 04/17/2017   Procedure: Tilt Table Study + / - Loop Recorder;  Surgeon: Duke Salvia, MD;  Location: [facility] INVASIVE CV LAB;  Service: Cardiovascular;  Laterality: N/A;        Home Medications    Prior to Admission medications   Medication Sig Start Date End Date Taking? Authorizing Provider   ibuprofen (ADVIL,MOTRIN) 800 MG tablet Take 800 mg by mouth daily as needed for mild pain.  12/13/16   [provider]  losartan (COZAAR) 100 MG tablet Take 100 mg by mouth daily.    [provider]    Family History No family history on file.  Social History Social History   Tobacco Use  . Smoking status: Current Every Day Smoker    Packs/day: 0.50    Types: Cigarettes  . Smokeless tobacco: Never Used  Substance Use Topics  . Alcohol use: Yes  . Drug use: No     Allergies   Bee venom   Review of Systems Review of Systems  Constitutional: Positive for diaphoresis. Negative for fever.  HENT: Negative for congestion.   Respiratory: Positive for shortness of breath. Negative for cough.   Cardiovascular: Positive for chest pain.  Gastrointestinal: Negative for vomiting.  Neurological: Negative for syncope.  All other systems reviewed and are negative.    Physical Exam Updated Vital Signs BP (!) 153/115   Pulse 85   Temp 98.2 F (36.8 C) (Oral)   Resp 19   SpO2 95%   Physical Exam CONSTITUTIONAL: Well developed/well nourished HEAD: Normocephalic/atraumatic EYES: EOMI/PERRL ENMT: Mucous membranes moist NECK: supple no meningeal signs SPINE/BACK:entire spine nontender CV: S1/S2 noted, no murmurs/rubs/gallops noted LUNGS: Lungs are clear to auscultation bilaterally,  no apparent distress ABDOMEN: soft, nontender, no rebound or guarding, bowel sounds noted throughout abdomen GU:no cva tenderness NEURO: Pt is awake/alert/appropriate, moves all extremitiesx4.  No facial droop.   EXTREMITIES: pulses normal/equal, full ROM, no calf tenderness or edema SKIN: warm, color normal PSYCH: no abnormalities of mood noted, alert and oriented to situation   ED Treatments / Results  Labs (all labs ordered are listed, but only abnormal results are displayed) Labs Reviewed  BASIC METABOLIC PANEL - Abnormal; Notable for the following components:      Result  Value   CO2 20 (*)    Glucose, Bld 120 (*)    Calcium 8.8 (*)    All other components within normal limits  CBC - Abnormal; Notable for the following components:   RBC 5.93 (*)    All other components within normal limits  I-STAT TROPONIN, ED  I-STAT TROPONIN, ED    EKG EKG Interpretation  Date/Time:  Friday November 21 2018 00:28:18 EST Ventricular Rate:  113 PR Interval:  146 QRS Duration: 96 QT Interval:  344 QTC Calculation: 471 R Axis:   -21 Text Interpretation:  Sinus tachycardia with Premature supraventricular complexes Otherwise normal ECG No previous ECGs available Confirmed by Zadie Rhine ([id number]) on 11/21/2018 12:38:25 AM   Radiology Dg Chest 2 View  Result Date: 11/21/2018 CLINICAL DATA:  Acute onset of diaphoresis, shortness of breath and sharp mid chest pressure. EXAM: CHEST - 2 VIEW COMPARISON:  Chest radiograph performed 02/07/2015 FINDINGS: The lungs are well-aerated. Mild retrocardiac airspace opacity may reflect atelectasis or possibly mild pneumonia. There is no evidence of pleural effusion or pneumothorax. The heart is normal in size; the mediastinal contour is within normal limits. No acute osseous abnormalities are seen. IMPRESSION: Mild retrocardiac airspace opacity may reflect atelectasis or possibly mild pneumonia. Electronically Signed   By: Roanna Raider M.D.   On: 11/21/2018 01:16    Procedures Procedures   Medications Ordered in ED Medications  aspirin chewable tablet 324 mg (324 mg Oral Given 11/21/18 0226)     Initial Impression / Assessment and Plan / ED Course  I have reviewed the triage vital signs and the nursing notes.  Pertinent labs & imaging results that were available during my care of the patient were reviewed by me and considered in my medical decision making (see chart for details).     2:23 AM HEART score 5 Will get repeat troponin but will likely need admit ?pna on CXR but pt denies infectious symptoms    Patient stable  in the ER for several hours.  He had no recurrence of his chest pain or pressure.  Repeat troponin was negative. No hypoxia or significant tachycardia to suggest PE I have low suspicion for dissection.  Patient had no infectious symptoms to suggest pneumonia.  Doubt findings on CXR represent pneumonia  Expressed my concerns with his history, and advised admission.  After further discussion and shared decision-making, patient declines admission and would like to follow-up as an outpatient. We discussed return precautions.  He has already been seen by cardiology before, he can follow-up again. Final Clinical Impressions(s) / ED Diagnoses   Final diagnoses:  Precordial pain  Essential hypertension    ED Discharge Orders    None       Zadie Rhine, MD 11/21/18 [phone]

## 2018-11-21 NOTE — ED Notes (Signed)
ED Provider at bedside. 

## 2018-11-21 NOTE — Discharge Instructions (Addendum)

## 2018-11-21 NOTE — ED Notes (Signed)
Patient transported to X-ray 

## 2018-11-21 NOTE — ED Triage Notes (Signed)
Pt reports he was at work, became sweaty and felt like someone was "pushing on my chest", associated with SOB. At worse, the pain was 8/10, now 3/10. No meds PTA.

## 2018-11-28 ENCOUNTER — Telehealth: Payer: Self-pay

## 2018-11-28 NOTE — Telephone Encounter (Signed)
SENT REFERRAL TO SCHEDULING AND FILED NOTES 

## 2018-12-02 ENCOUNTER — Ambulatory Visit: Payer: Self-pay | Admitting: Physician Assistant

## 2018-12-02 DIAGNOSIS — R0989 Other specified symptoms and signs involving the circulatory and respiratory systems: Secondary | ICD-10-CM

## 2018-12-02 NOTE — Progress Notes (Deleted)
Cardiology Office Note    Date:  12/02/2018   ID:  Roberto Turner, DOB November 13, 1971, MRN [id number]  PCP:  Selinda Flavin, MD  Cardiologist:  Dr. Graciela Husbands   Chief Complaint: CP  History of Present Illness:   Roberto Turner is a 48 y.o. male with hx of recurrent syncope, HTN, orthostatics and obesity presents for chest pain.   Followed by Dr. Graciela Husbands for [facility] syncope.  Echocardiogram 01/2017 showed LV function of 45 to 50%, diffuse hypokinesis, grade 2 diastolic dysfunction.  Follow-up cardiac MRI 02/2017 showed normal LV function at 64%, no Marano motion abnormality and without prior evidence of MI.  Last seen by Dr. Graciela Husbands when he was admitted 03/2017 for table tilt test which was positive.  Per result, Dr. 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 In the context of this discussion it became clear that depression and anger are a concern and I recommended that he consider pursuing help for these areas. I told him that there was no evidence of cardiovascular syncope".    Past Medical History:  Diagnosis Date  . Body mass index (bmi) 38.0-38.9, adult   . Hypertension   . Syncope and collapse     Past Surgical History:  Procedure Laterality Date  . BACK SURGERY    . TILT TABLE STUDY N/A 04/17/2017   Procedure: Tilt Table Study + / - Loop Recorder;  Surgeon: Duke Salvia, MD;  Location: [facility] INVASIVE CV LAB;  Service: Cardiovascular;  Laterality: N/A;    Current Medications: Prior to Admission medications   Medication Sig Start Date End Date Taking? Authorizing Provider  ibuprofen (ADVIL,MOTRIN) 800 MG tablet Take 800 mg by mouth daily as needed for mild pain.  12/13/16   [provider]  losartan (COZAAR) 100 MG tablet Take 100 mg by mouth daily.    [provider]    Allergies:   Bee venom   Social History   Socioeconomic History  . Marital status: Married    Spouse name: Not on file  . Number of children:  Not on file  . Years of education: Not on file  . Highest education level: Not on file  Occupational History  . Not on file  Social Needs  . Financial resource strain: Not on file  . Food insecurity:    Worry: Not on file    Inability: Not on file  . Transportation needs:    Medical: Not on file    Non-medical: Not on file  Tobacco Use  . Smoking status: Current Every Day Smoker    Packs/day: 0.50    Types: Cigarettes  . Smokeless tobacco: Never Used  Substance and Sexual Activity  . Alcohol use: Yes  . Drug use: No  . Sexual activity: Not on file  Lifestyle  . Physical activity:    Days per week: Not on file    Minutes per session: Not on file  . Stress: Not on file  Relationships  . Social connections:    Talks on phone: Not on file    Gets together: Not on file    Attends religious service: Not on file    Active member of club or organization: Not on file    Attends meetings of clubs or organizations: Not on file    Relationship status: Not on file  Other Topics Concern  . Not on file  Social History Narrative  . Not on file  Family History:  The patient's family history is not on file. ***  ROS:   Please see the history of present illness.    ROS All other systems reviewed and are negative.   PHYSICAL EXAM:   VS:  There were no vitals taken for this visit.   GEN: Well nourished, well developed, in no acute distress  HEENT: normal  Neck: no JVD, carotid bruits, or masses Cardiac: ***RRR; no murmurs, rubs, or gallops,no edema  Respiratory:  clear to auscultation bilaterally, normal work of breathing GI: soft, nontender, nondistended, + BS MS: no deformity or atrophy  Skin: warm and dry, no rash Neuro:  Alert and Oriented x 3, Strength and sensation are intact Psych: euthymic mood, full affect  Wt Readings from Last 3 Encounters:  04/17/17 250 lb (113.4 kg)  01/17/17 248 lb (112.5 kg)  02/10/16 250 lb (113.4 kg)      Studies/Labs Reviewed:    EKG:  EKG is ordered today.  The ekg ordered today demonstrates ***  Recent Labs: 11/21/2018: BUN 18; Creatinine, Ser 1.17; Hemoglobin 15.8; Platelets 232; Potassium 3.7; Sodium 138   Lipid Panel No results found for: CHOL, TRIG, HDL, CHOLHDL, VLDL, LDLCALC, LDLDIRECT  Additional studies/ records that were reviewed today include:   Tilt Table study  03/2017 The patient was admitted to 70 head up tilt. After 15 minutes he was given sublingual nitroglycerin 0.4 mg. 2 minutes later he became unresponsive. He was returned to the supine position His eyes were closed. Blood pressure was stable. Heart rate didn't change. There was guarding with hand drop.  He returned to arousal. He describes this episode as stereotypical  After the procedure I spoke with him and his wife. 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 In the context of this discussion it became clear that depression and anger are a concern and I recommended that he consider pursuing help for these areas. I told him that there was no evidence of cardiovascular syncope   There was no blood loss for this procedure.  During this procedure no sedation was administered.  Cardiac MRI 03/07/17 IMPRESSION: 1. Normal LV size and systolic function, EF 64%. No regional Ola motion abnormalities.  2.  Normal RV size and systolic function.  3. No myocardial LGE, so no definitive evidence for prior MI, infiltrative disease, or myocarditis.  Echo 01/2017 Study Conclusions  - Left ventricle: The cavity size was normal. Mccaslin thickness was   normal. Systolic function was low normal to mildly reduced. The   estimated ejection fraction was in the range of 45% to 50%.   Diffuse hypokinesis. Features are consistent with a pseudonormal   left ventricular filling pattern, with concomitant abnormal   relaxation and increased filling pressure (grade 2 diastolic   dysfunction). - Aortic valve:  Mildly to moderately calcified annulus. Trileaflet. - Mitral valve: Mildly calcified annulus. - Right ventricle: Systolic function was low normal to mildly   reduced.   ASSESSMENT & PLAN:    1. ***    Medication Adjustments/Labs and Tests Ordered: Current medicines are reviewed at length with the patient today.  Concerns regarding medicines are outlined above.  Medication changes, Labs and Tests ordered today are listed in the Patient Instructions below. There are no Patient Instructions on file for this visit.   Lorelei PontSigned, Jozette Castrellon, [place]  12/02/2018 8:31 AM    [facility] [address], [place], [place]  [address] Phone: [phone]; Fax: [phone]

## 2018-12-04 ENCOUNTER — Encounter: Payer: Self-pay | Admitting: Physician Assistant

## 2019-03-10 ENCOUNTER — Other Ambulatory Visit: Payer: Self-pay | Admitting: Neurosurgery

## 2019-03-10 DIAGNOSIS — M961 Postlaminectomy syndrome, not elsewhere classified: Secondary | ICD-10-CM

## 2019-04-21 ENCOUNTER — Telehealth: Payer: Self-pay

## 2019-04-21 NOTE — Telephone Encounter (Signed)
Spoke with patient to screen his medications and allergies prior to being scheduled for a myelogram.  He was informed he will be here 2-2.5 hours, will need a driver and will need to be on strict bedrest for 24 hours after the procedure.  There are no medications he needs to hold for this procedure.

## 2019-05-18 NOTE — Discharge Instructions (Signed)

## 2019-05-19 ENCOUNTER — Encounter (HOSPITAL_COMMUNITY): Payer: Self-pay | Admitting: Emergency Medicine

## 2019-05-19 ENCOUNTER — Ambulatory Visit
Admission: RE | Admit: 2019-05-19 | Discharge: 2019-05-19 | Disposition: A | Discharge status: Home / Self Care | Payer: Managed Care, Other (non HMO) | Source: Ambulatory Visit | Attending: Neurosurgery | Admitting: Neurosurgery

## 2019-05-19 ENCOUNTER — Other Ambulatory Visit: Payer: Self-pay

## 2019-05-19 ENCOUNTER — Emergency Department (HOSPITAL_COMMUNITY)
Admission: EM | Admit: 2019-05-19 | Discharge: 2019-05-19 | Disposition: A | Discharge status: Home / Self Care | Payer: Managed Care, Other (non HMO) | Attending: Emergency Medicine | Admitting: Emergency Medicine

## 2019-05-19 DIAGNOSIS — T50905A Adverse effect of unspecified drugs, medicaments and biological substances, initial encounter: Secondary | ICD-10-CM | POA: Diagnosis not present

## 2019-05-19 DIAGNOSIS — R4182 Altered mental status, unspecified: Secondary | ICD-10-CM | POA: Insufficient documentation

## 2019-05-19 DIAGNOSIS — F17210 Nicotine dependence, cigarettes, uncomplicated: Secondary | ICD-10-CM | POA: Insufficient documentation

## 2019-05-19 DIAGNOSIS — Z79899 Other long term (current) drug therapy: Secondary | ICD-10-CM | POA: Diagnosis not present

## 2019-05-19 DIAGNOSIS — I10 Essential (primary) hypertension: Secondary | ICD-10-CM | POA: Insufficient documentation

## 2019-05-19 DIAGNOSIS — M961 Postlaminectomy syndrome, not elsewhere classified: Secondary | ICD-10-CM

## 2019-05-19 LAB — CBC WITH DIFFERENTIAL/PLATELET
Abs Immature Granulocytes: 0.02 10*3/uL (ref 0.00–0.07)
Basophils Absolute: 0.1 10*3/uL (ref 0.0–0.1)
Basophils Relative: 2 %
Eosinophils Absolute: 0.2 10*3/uL (ref 0.0–0.5)
Eosinophils Relative: 3 %
HCT: 49.7 % (ref 39.0–52.0)
Hemoglobin: 15.6 g/dL (ref 13.0–17.0)
Immature Granulocytes: 0 %
Lymphocytes Relative: 42 %
Lymphs Abs: 3.1 10*3/uL (ref 0.7–4.0)
MCH: 26.7 pg (ref 26.0–34.0)
MCHC: 31.4 g/dL (ref 30.0–36.0)
MCV: 85 fL (ref 80.0–100.0)
Monocytes Absolute: 0.6 10*3/uL (ref 0.1–1.0)
Monocytes Relative: 8 %
Neutro Abs: 3.4 10*3/uL (ref 1.7–7.7)
Neutrophils Relative %: 45 %
Platelets: 186 10*3/uL (ref 150–400)
RBC: 5.85 MIL/uL — ABNORMAL HIGH (ref 4.22–5.81)
RDW: 13.3 % (ref 11.5–15.5)
WBC: 7.4 10*3/uL (ref 4.0–10.5)
nRBC: 0 % (ref 0.0–0.2)

## 2019-05-19 LAB — BASIC METABOLIC PANEL
Anion gap: 9 (ref 5–15)
BUN: 10 mg/dL (ref 6–20)
CO2: 25 mmol/L (ref 22–32)
Calcium: 8.5 mg/dL — ABNORMAL LOW (ref 8.9–10.3)
Chloride: 108 mmol/L (ref 98–111)
Creatinine, Ser: 0.95 mg/dL (ref 0.61–1.24)
GFR calc Af Amer: >60 mL/min (ref >60)
GFR calc non Af Amer: >60 mL/min (ref >60)
Glucose, Bld: 86 mg/dL (ref 70–99)
Potassium: 3.8 mmol/L (ref 3.5–5.1)
Sodium: 142 mmol/L (ref 135–145)

## 2019-05-19 LAB — CBG MONITORING, ED: Glucose-Capillary: 89 mg/dL (ref 70–99)

## 2019-05-19 MED ORDER — LOSARTAN POTASSIUM 50 MG PO TABS
100.0000 mg | ORAL_TABLET | Freq: Once | ORAL | Status: AC
  Administered 2019-05-19: 100 mg via ORAL
  Filled 2019-05-19: qty 2

## 2019-05-19 MED ORDER — DIAZEPAM 5 MG PO TABS
10.0000 mg | ORAL_TABLET | Freq: Once | ORAL | Status: AC
  Administered 2019-05-19: 10 mg via ORAL

## 2019-05-19 MED ORDER — IOPAMIDOL (ISOVUE-M 200) INJECTION 41%
15.0000 mL | Freq: Once | INTRAMUSCULAR | Status: AC
  Administered 2019-05-19: 09:00:00 15 mL via INTRATHECAL

## 2019-05-19 MED ORDER — LOSARTAN POTASSIUM 100 MG PO TABS: 100.0000 mg | ORAL_TABLET | Freq: Every day | ORAL | 0 refills | Status: AC

## 2019-05-19 MED ORDER — SODIUM CHLORIDE 0.9 % IV BOLUS
1000.0000 mL | Freq: Once | INTRAVENOUS | Status: AC
  Administered 2019-05-19: 1000 mL via INTRAVENOUS

## 2019-05-19 NOTE — Discharge Instructions (Signed)
I suspect that your sleepiness and confusion was related to the medication you received with radiology.  Your work-up has been reassuring.  Your blood pressure was elevated on arrival, please make sure you are taking your blood pressure medication regularly, I have provided you with a refill prescription.  Please follow-up with your primary care doctor.  Return for any other new or concerning symptoms.

## 2019-05-19 NOTE — Progress Notes (Signed)
CBG 133 at 0920 05/19/2019

## 2019-05-19 NOTE — Progress Notes (Signed)
Patient here for a lumbar myelogram.  He received Valium 10mg  PO prior to the procedure per protocol.  During the procedure he "passed out" four times.  He was brought to the nursing station for assessment.  Initially, he was in trendelenburg, but after several BP readings 200-230/140-165 the stretcher was levelled out.  Heart rate 60-70s, NSR without ectopy.  In the nursing station patient was arouseable, though this was challenging.  Twice he lost consciousness and was briefly unarouseable.  Speech garbled throughout.  No evidence of seizure activity.  No post-ictal state observed. Heart rate and BP did not fluctuate as if he were having a vaso vagal episode.  BP did not lower from the Valium given pre-procedure.  (Pre-procedure vitals were 182/119 with pulse 63.) Wife reports this has happened multiple times in the past two years.  He was worked up for this with no apparent cause found.  Initially, she stated he passes out when in pain, but then stated he has passed out while doing mundane activities.  He once did so while driving.  Denies chest pain.  Reports only back pain which is why he was here for the lumbar myelogram.  Unclear if patient took his Losartan this morning.  Only other meds are Ibuprofen and naproxen.  After Dr. Nelia Shi spoke with patient's wife it was decided he should be evaluated at the hospital.  EMS was summoned and took patient to [facility].  Belongings to wife (shirt, wallet).

## 2019-05-19 NOTE — ED Provider Notes (Signed)
[facility] DEPARTMENT Provider Note   CSN: [id number] Arrival date & time: 05/19/19  1006    History   Chief Complaint Chief Complaint  Patient presents with  . Altered Mental Status    HPI Roberto Turner is a 48 y.o. male.     Roberto Turner is a 48 y.o. male with a history of hypertension, syncope and low back pain, who presents to the emergency department for evaluation of altered mental status.  Per EMS patient was at [facility] where he was having a myelogram completed, patient was given 10 mg of p.o. Valium prior to the procedure, and afterwards staff were initially unable to wake up with the patient, with EMS patient became more arousable but is still somnolent.  He is able to answer questions.  On my exam patient is more awake and alert, able to answer all questions, reports that he has history of similar reaction to any type of muscle relaxer, it seems to make him extremely sleepy.  He denies any focal symptoms, no headache, vision changes, nausea or vomiting.  No numbness or weakness.  No chest pain or shortness of breath.  No lightheadedness or dizziness.  No abdominal pain. Notes from the [facility] imaging center show that during the procedure the patient "passed out", throughout this he was noted to be hypertensive with normal pulse, they noted 2 episodes after the procedure were the patient again seemed to "lose consciousness".  There was no noted seizure activity or postictal state observed.  Patient was noted to be hypertensive there as well as on arrival here in the ED, reports he has not taken his blood pressure medication today, typically takes 100 mg losartan.  Per patient's wife this is happened multiple times and he has had extensive evaluation for syncopal episodes, without clear cause.      Past Medical History:  Diagnosis Date  . Body mass index (bmi) 38.0-38.9, adult   . Hypertension   . Syncope and collapse     Patient  Active Problem List   Diagnosis Date Noted  . Syncope 01/16/2017  . Dizzinesses 01/16/2017  . Blurred vision 01/16/2017  . Myalgia 01/16/2017  . Cough 01/16/2017  . Sinus congestion 01/16/2017  . Sore throat 01/16/2017    Past Surgical History:  Procedure Laterality Date  . BACK SURGERY    . TILT TABLE STUDY N/A 04/17/2017   Procedure: Tilt Table Study + / - Loop Recorder;  Surgeon: Deboraha Sprang, MD;  Location: [facility];  Service: Cardiovascular;  Laterality: N/A;        Home Medications    Prior to Admission medications   Medication Sig Start Date End Date Taking? Authorizing Provider  ibuprofen (ADVIL,MOTRIN) 800 MG tablet Take 800 mg by mouth daily as needed for mild pain.  12/13/16  Yes [provider]  Potassium 95 MG TABS Take 95 mg by mouth daily.   Yes [provider]  losartan (COZAAR) 100 MG tablet Take 1 tablet (100 mg total) by mouth daily. 05/19/19   Jacqlyn Larsen, PA-C    Family History No family history on file.  Social History Social History   Tobacco Use  . Smoking status: Current Every Day Smoker    Packs/day: 0.50    Types: Cigarettes  . Smokeless tobacco: Never Used  Substance Use Topics  . Alcohol use: Yes  . Drug use: No     Allergies   Bee venom  Review of Systems Review of Systems  Constitutional: Negative for chills and fever.  HENT: Negative.   Eyes: Negative for visual disturbance.  Respiratory: Negative for cough and shortness of breath.   Cardiovascular: Negative for chest pain.  Gastrointestinal: Negative for abdominal pain, nausea and vomiting.  Genitourinary: Negative for dysuria.  Musculoskeletal: Negative for arthralgias and myalgias.  Skin: Negative for color change and rash.  Neurological: Negative for dizziness, seizures, speech difficulty, weakness, numbness and headaches.     Physical Exam Updated Vital Signs BP (!) 185/116 (BP Location: Right Arm)   Pulse (!) 58   Temp 97.9 F  (36.6 C) (Oral)   Resp 15   Ht 5\' 9"  (1.753 m)   Wt 113.4 kg   SpO2 97%   BMI 36.92 kg/m   Physical Exam Vitals signs and nursing note reviewed.  Constitutional:      General: He is not in acute distress.    Appearance: He is well-developed. He is not diaphoretic.  HENT:     Head: Normocephalic and atraumatic.  Eyes:     General:        Right eye: No discharge.        Left eye: No discharge.     Pupils: Pupils are equal, round, and reactive to light.  Neck:     Musculoskeletal: Neck supple.  Cardiovascular:     Rate and Rhythm: Normal rate and regular rhythm.     Heart sounds: Normal heart sounds.  Pulmonary:     Effort: Pulmonary effort is normal. No respiratory distress.     Breath sounds: Normal breath sounds. No wheezing or rales.     Comments: Respirations equal and unlabored, patient able to speak in full sentences, lungs clear to auscultation bilaterally Abdominal:     General: Bowel sounds are normal. There is no distension.     Palpations: Abdomen is soft. There is no mass.     Tenderness: There is no abdominal tenderness. There is no guarding.     Comments: Abdomen soft, nondistended, nontender to palpation in all quadrants without guarding or peritoneal signs  Musculoskeletal:        General: No deformity.  Skin:    General: Skin is warm and dry.     Capillary Refill: Capillary refill takes less than 2 seconds.  Neurological:     Mental Status: He is alert and oriented to person, place, and time.     Coordination: Coordination normal.     Comments: Speech is clear, able to follow commands CN III-XII intact Normal strength in upper and lower extremities bilaterally including dorsiflexion and plantar flexion, strong and equal grip strength Sensation normal to light and sharp touch Moves extremities without ataxia, coordination intact   Psychiatric:        Mood and Affect: Mood normal.        Behavior: Behavior normal.      ED Treatments / Results   Labs (all labs ordered are listed, but only abnormal results are displayed) Labs Reviewed  BASIC METABOLIC PANEL - Abnormal; Notable for the following components:      Result Value   Calcium 8.5 (*)    All other components within normal limits  CBC WITH DIFFERENTIAL/PLATELET - Abnormal; Notable for the following components:   RBC 5.85 (*)    All other components within normal limits  CBG MONITORING, ED    EKG EKG Interpretation  Date/Time:  Tuesday May 19 2019 10:14:03 EDT Ventricular Rate:  63 PR Interval:  QRS Duration: 94 QT Interval:  421 QTC Calculation: 431 R Axis:   -2 Text Interpretation:  Sinus rhythm Borderline T abnormalities, inferior leads Since last tracing rate slower Confirmed by Linwood DibblesKnapp, Jon [phone]([id number]) on 05/19/2019 10:25:52 AM   Radiology None  Procedures Procedures (including critical care time)  Medications Ordered in ED Medications  sodium chloride 0.9 % bolus 1,000 mL (0 mLs Intravenous Stopped 05/19/19 1309)  losartan (COZAAR) tablet 100 mg (100 mg Oral Given 05/19/19 1211)     Initial Impression / Assessment and Plan / ED Course  I have reviewed the triage vital signs and the nursing notes.  Pertinent labs & imaging results that were available during my care of the patient were reviewed by me and considered in my medical decision making (see chart for details).  Patient presents with altered mental status and possible syncopal episodes after receiving Valium for a radiology study.  Patient reports history of similar reactions when he is received muscle relaxers, his mental status is already significantly improving here and he is now much more awake and able to answer questions.  He denies any associated symptoms.  He is hypertensive here, reports he did not take his blood pressure medication this morning, will give his home losartan, given concern for possible syncope will check EKG and basic labs.  Patient's neurologic exam is normal he did not fall to  the ground or have any head injury and I do not feel that head imaging is indicated at this time.  Labs reassuring, no acute electrolyte derangements, normal renal function, no leukocytosis.  EKG with no significant ischemic changes, rate slower when compared to last tracing.  Patient has completely returned to baseline, he is now requesting to go home, his labs look good and his blood pressure has significantly improved and I feel he is stable for discharge home at this time.  Provided refill prescription of his blood pressure medication and encouraged him to follow-up with PCP.  Return precautions discussed.  Patient was discharged home in good condition.  Final Clinical Impressions(s) / ED Diagnoses   Final diagnoses:  Altered mental status, unspecified altered mental status type  Adverse effect of drug, initial encounter  Hypertension, unspecified type    ED Discharge Orders         Ordered    losartan (COZAAR) 100 MG tablet  Daily     05/19/19 1259           Jodi GeraldsFord, Breeana Sawtelle RoselleN, [place] JerseyPA-C 05/19/19 1354    Linwood DibblesKnapp, Jon, MD 05/21/19 [id number]

## 2019-05-19 NOTE — ED Triage Notes (Signed)
To ED via [facility] from [facility]- pt was given VALIUM 10mg  po prior to myelogram, and staff were unable to wake pt up-- on arrival- pt is lethargic but arousable, will answer questions. Denies taking any other medicine-- did not take BP meds this am.

## 2019-05-19 NOTE — ED Notes (Signed)
Notified EDP wishes to leave. I explained he needed IVF and medications, but that I would notify EDP.

## 2019-05-19 NOTE — ED Notes (Signed)
Pt wife Heather [phone]. She is in the parking lot
